# Patient Record
Sex: Female | Born: 1990 | Race: Black or African American | Hispanic: No | Marital: Single
Health system: Southern US, Community
[De-identification: ages and names within clinical notes are randomized; demographics above are authoritative.]

---

## 2017-09-01 ENCOUNTER — Emergency Department (HOSPITAL_COMMUNITY): Payer: Medicaid Other

## 2017-09-01 ENCOUNTER — Emergency Department (HOSPITAL_COMMUNITY)
Admission: EM | Admit: 2017-09-01 | Discharge: 2017-09-02 | Disposition: A | Discharge status: Home / Self Care | Payer: Medicaid Other | Attending: Emergency Medicine | Admitting: Emergency Medicine

## 2017-09-01 ENCOUNTER — Encounter (HOSPITAL_COMMUNITY): Payer: Self-pay | Admitting: Emergency Medicine

## 2017-09-01 DIAGNOSIS — E876 Hypokalemia: Secondary | ICD-10-CM | POA: Diagnosis not present

## 2017-09-01 DIAGNOSIS — R112 Nausea with vomiting, unspecified: Secondary | ICD-10-CM

## 2017-09-01 DIAGNOSIS — R51 Headache: Secondary | ICD-10-CM | POA: Insufficient documentation

## 2017-09-01 DIAGNOSIS — R05 Cough: Secondary | ICD-10-CM | POA: Insufficient documentation

## 2017-09-01 DIAGNOSIS — R1084 Generalized abdominal pain: Secondary | ICD-10-CM | POA: Diagnosis present

## 2017-09-01 DIAGNOSIS — R197 Diarrhea, unspecified: Secondary | ICD-10-CM | POA: Insufficient documentation

## 2017-09-01 LAB — COMPREHENSIVE METABOLIC PANEL
ALK PHOS: 59 U/L (ref 38–126)
ALK PHOS: 47 U/L (ref 38–126)
ALT: 25 U/L (ref 0–44)
ALT: 22 U/L (ref 0–44)
ANION GAP: 12 (ref 5–15)
ANION GAP: 7 (ref 5–15)
AST: 32 U/L (ref 15–41)
AST: 27 U/L (ref 15–41)
Albumin: 3.9 g/dL (ref 3.5–5.0)
Albumin: 3.2 g/dL — ABNORMAL LOW (ref 3.5–5.0)
BILIRUBIN TOTAL: 0.8 mg/dL (ref 0.3–1.2)
BUN: <5 mg/dL — ABNORMAL LOW (ref 6–20)
BUN: <5 mg/dL — ABNORMAL LOW (ref 6–20)
CALCIUM: 8.5 mg/dL — AB (ref 8.9–10.3)
CALCIUM: 7.3 mg/dL — AB (ref 8.9–10.3)
CHLORIDE: 101 mmol/L (ref 98–111)
CO2: 25 mmol/L (ref 22–32)
CO2: 23 mmol/L (ref 22–32)
Chloride: 107 mmol/L (ref 98–111)
Creatinine, Ser: 0.79 mg/dL (ref 0.44–1.00)
Creatinine, Ser: 0.66 mg/dL (ref 0.44–1.00)
GFR calc non Af Amer: >60 mL/min (ref >60)
GFR calc non Af Amer: >60 mL/min (ref >60)
Glucose, Bld: 96 mg/dL (ref 70–99)
Glucose, Bld: 91 mg/dL (ref 70–99)
POTASSIUM: 2.6 mmol/L — AB (ref 3.5–5.1)
Potassium: 2.9 mmol/L — ABNORMAL LOW (ref 3.5–5.1)
SODIUM: 138 mmol/L (ref 135–145)
Sodium: 137 mmol/L (ref 135–145)
TOTAL PROTEIN: 5.6 g/dL — AB (ref 6.5–8.1)
Total Bilirubin: 0.7 mg/dL (ref 0.3–1.2)
Total Protein: 7 g/dL (ref 6.5–8.1)

## 2017-09-01 LAB — URINALYSIS, ROUTINE W REFLEX MICROSCOPIC
Bilirubin Urine: NEGATIVE
GLUCOSE, UA: NEGATIVE mg/dL
KETONES UR: 5 mg/dL — AB
Leukocytes, UA: NEGATIVE
Nitrite: NEGATIVE
PROTEIN: NEGATIVE mg/dL
Specific Gravity, Urine: 1.03 (ref 1.005–1.030)
pH: 5 (ref 5.0–8.0)

## 2017-09-01 LAB — CBC
HEMATOCRIT: 35.9 % — AB (ref 36.0–46.0)
HEMOGLOBIN: 11.1 g/dL — AB (ref 12.0–15.0)
MCH: 25.2 pg — AB (ref 26.0–34.0)
MCHC: 30.9 g/dL (ref 30.0–36.0)
MCV: 81.6 fL (ref 78.0–100.0)
Platelets: 241 10*3/uL (ref 150–400)
RBC: 4.4 MIL/uL (ref 3.87–5.11)
RDW: 14.4 % (ref 11.5–15.5)
WBC: 5.7 10*3/uL (ref 4.0–10.5)

## 2017-09-01 LAB — LIPASE, BLOOD: LIPASE: 29 U/L (ref 11–51)

## 2017-09-01 LAB — I-STAT BETA HCG BLOOD, ED (MC, WL, AP ONLY): I-stat hCG, quantitative: <5 m[IU]/mL (ref <5)

## 2017-09-01 MED ORDER — MORPHINE SULFATE (PF) 4 MG/ML IV SOLN
4.0000 mg | Freq: Once | INTRAVENOUS | Status: AC
  Administered 2017-09-01: 4 mg via INTRAVENOUS
  Filled 2017-09-01: qty 1

## 2017-09-01 MED ORDER — POTASSIUM CHLORIDE CRYS ER 20 MEQ PO TBCR
40.0000 meq | EXTENDED_RELEASE_TABLET | Freq: Once | ORAL | Status: AC
  Administered 2017-09-01: 40 meq via ORAL
  Filled 2017-09-01: qty 2

## 2017-09-01 MED ORDER — SODIUM CHLORIDE 0.9 % IV BOLUS
1000.0000 mL | Freq: Once | INTRAVENOUS | Status: AC
  Administered 2017-09-01: 1000 mL via INTRAVENOUS

## 2017-09-01 MED ORDER — ONDANSETRON HCL 4 MG/2ML IJ SOLN
4.0000 mg | Freq: Once | INTRAMUSCULAR | Status: AC
  Administered 2017-09-01: 4 mg via INTRAVENOUS
  Filled 2017-09-01: qty 2

## 2017-09-01 MED ORDER — PROMETHAZINE HCL 25 MG/ML IJ SOLN
25.0000 mg | Freq: Once | INTRAMUSCULAR | Status: AC
  Administered 2017-09-01: 25 mg via INTRAVENOUS
  Filled 2017-09-01: qty 1

## 2017-09-01 MED ORDER — POTASSIUM CHLORIDE 10 MEQ/100ML IV SOLN
10.0000 meq | Freq: Once | INTRAVENOUS | Status: AC
  Administered 2017-09-01: 10 meq via INTRAVENOUS
  Filled 2017-09-01: qty 100

## 2017-09-01 NOTE — ED Provider Notes (Signed)
[facility] EMERGENCY DEPARTMENT Provider Note   CSN: [id number] Arrival date & time: 09/01/17  1753     History   Chief Complaint Chief Complaint  Patient presents with  . Abdominal Pain  . Emesis    HPI Alisha Burns is a 27 y.o. female.  The history is provided by the patient. No language interpreter was used.     27 year old female without any significant past medical history presenting for evaluation of abdominal pain.  Patient report for nearly a week she has had subjective fever, chills, headaches, body aches, persistent nonproductive cough, abdominal discomfort as well as nausea vomiting diarrhea.  States that she is unable to keep anything down as eating causes her to vomit nonbloody nonbilious contents as well as having loose stools without blood or mucus.  She feels dehydrated.  She tried to stay hydrated with fluid.  She denies earaches, runny nose, sneezing, sore throat or neck pain.  She recently finished her menstruation but denies any vaginal discharge or dysuria.  No recent travel.  History reviewed. No pertinent past medical history.  There are no active problems to display for this patient.   The histories are not reviewed yet. Please review them in the "History" navigator section and refresh this SmartLink.   OB History   None      Home Medications    Prior to Admission medications   Not on File    Family History No family history on file.  Social History Social History   Tobacco Use  . Smoking status: Not on file  Substance Use Topics  . Alcohol use: Not on file  . Drug use: Not on file     Allergies   Patient has no known allergies.   Review of Systems Review of Systems  All other systems reviewed and are negative.    Physical Exam Updated Vital Signs BP 127/82   Pulse 87   Temp 98.8 F (37.1 C)   Resp 17   LMP 08/25/2017 (LMP Unknown)   SpO2 98%   Physical Exam  Constitutional: She appears  well-developed and well-nourished. No distress.  HENT:  Head: Atraumatic.  Mouth/Throat: Oropharyngeal exudate present.  Eyes: Conjunctivae are normal.  Neck: Neck supple.  Cardiovascular: Normal rate and regular rhythm.  Pulmonary/Chest: Effort normal and breath sounds normal.  Abdominal: Soft. Normal appearance and bowel sounds are normal. There is generalized tenderness. There is no tenderness at McBurney's point and negative Murphy's sign.  Neurological: She is alert.  Skin: No rash noted.  Psychiatric: She has a normal mood and affect.  Nursing note and vitals reviewed.    ED Treatments / Results  Labs (all labs ordered are listed, but only abnormal results are displayed) Labs Reviewed  COMPREHENSIVE METABOLIC PANEL - Abnormal; Notable for the following components:      Result Value   Potassium 2.6 (*)    BUN <5 (*)    Calcium 8.5 (*)    All other components within normal limits  CBC - Abnormal; Notable for the following components:   Hemoglobin 11.1 (*)    HCT 35.9 (*)    MCH 25.2 (*)    All other components within normal limits  URINALYSIS, ROUTINE W REFLEX MICROSCOPIC - Abnormal; Notable for the following components:   APPearance HAZY (*)    Hgb urine dipstick LARGE (*)    Ketones, ur 5 (*)    Bacteria, UA RARE (*)    All other components within normal limits  COMPREHENSIVE METABOLIC PANEL - Abnormal; Notable for the following components:   Potassium 2.9 (*)    BUN <5 (*)    Calcium 7.3 (*)    Total Protein 5.6 (*)    Albumin 3.2 (*)    All other components within normal limits  LIPASE, BLOOD  I-STAT BETA HCG BLOOD, ED ([facility], [facility], [facility] ONLY)    EKG None   Date: 09/01/2017  Rate: 63  Rhythm: normal sinus rhythm with sinus arrhythmia  QRS Axis: normal  Intervals: normal  ST/T Wave abnormalities: normal  Conduction Disutrbances: none  Narrative Interpretation: no U-wave  Old EKG Reviewed: No significant changes noted     Radiology Dg Chest 2  View  Result Date: 09/01/2017 CLINICAL DATA:  Patient with productive cough. EXAM: CHEST - 2 VIEW COMPARISON:  None. FINDINGS: Normal cardiac and mediastinal contours. No consolidative pulmonary opacities. No pleural effusion or pneumothorax. Regional skeleton unremarkable. IMPRESSION: No acute cardiopulmonary process. Electronically Signed   By: Annia Belt M.D.   On: 09/01/2017 19:53    Procedures Procedures (including critical care time)  Medications Ordered in ED Medications  sodium chloride 0.9 % bolus 1,000 mL (0 mLs Intravenous Stopped 09/01/17 2116)  morphine 4 MG/ML injection 4 mg (4 mg Intravenous Given 09/01/17 1951)  ondansetron (ZOFRAN) injection 4 mg (4 mg Intravenous Given 09/01/17 1950)  potassium chloride SA (K-DUR,KLOR-CON) CR tablet 40 mEq (40 mEq Oral Given 09/01/17 1952)  potassium chloride 10 mEq in 100 mL IVPB (0 mEq Intravenous Stopped 09/01/17 2116)  sodium chloride 0.9 % bolus 1,000 mL (0 mLs Intravenous Stopped 09/01/17 2250)  ondansetron (ZOFRAN) injection 4 mg (4 mg Intravenous Given 09/01/17 2114)  morphine 4 MG/ML injection 4 mg (4 mg Intravenous Given 09/01/17 2303)  promethazine (PHENERGAN) injection 25 mg (25 mg Intravenous Given 09/01/17 2302)     Initial Impression / Assessment and Plan / ED Course  I have reviewed the triage vital signs and the nursing notes.  Pertinent labs & imaging results that were available during my care of the patient were reviewed by me and considered in my medical decision making (see chart for details).     BP 116/68   Pulse 65   Temp 98.8 F (37.1 C)   Resp 17   LMP 08/25/2017 (LMP Unknown)   SpO2 98%    Final Clinical Impressions(s) / ED Diagnoses   Final diagnoses:  Nausea vomiting and diarrhea  Hypokalemia    ED Discharge Orders         Ordered    potassium chloride SA (K-DUR,KLOR-CON) 20 MEQ tablet  Daily     09/02/17 0015    promethazine (PHENERGAN) 25 MG tablet  Every 6 hours PRN     09/02/17 0015           7:10 PM Patient complaining of abdominal pain with nausea vomiting diarrhea, suspect viral etiology.  She also endorsed persistent cough.  Her lungs exam is unremarkable however chest x-ray ordered to rule out pneumonia.  We will give IV fluid, antinausea medication and pain medication.  9:21 PM Evidence of hypokalemia with a potassium of 2.6.  No EKG changes.  Labs otherwise reassuring.  Suspect patient is having viral etiology causing her symptoms.  Urine with evidence of hemoglobin and urine dipsticks.  Patient recently finishing her menstruation which is likely the cause of blood in her urine.  Chest x-ray without evidence of pneumonia or concerning changes.  10:58 PM Patient still endorse nausea and headache but abdominal  pain is since improved.  On reexamination, no nuchal rigidity concerning for meningitis, abdomen is mildly tender but improved from prior.  We will continue with symptom medic treatment as well as potassium replenishment.  We will recheck CMP.  12:06 AM Potassium level is improving to 2.9 after treatment.  Patient also receive additional IV fluid and antinausea medication. Considered influenza, however due to prolonged course, doubt antiviral will be beneficial.  Minimal abd pain at this time, doubt biliary disease or acute abdominal pathology.    12:11 AM Patient able to ambulate, tolerates p.o., and felt better.  She feels comfortable going home.  She understands she can return promptly if her condition worsen.   Fayrene Helper, PA-C 09/02/17 Bernita Buffy    Shaune Pollack, MD 09/02/17 1254

## 2017-09-01 NOTE — ED Triage Notes (Signed)
Pt states 5 days of emesis and diarrhea with a generalized abdominal pain. Afebrile. LMP last week.

## 2017-09-01 NOTE — ED Notes (Signed)
CRITICAL VALUE STICKER  CRITICAL VALUE: K 2.6  DATE & TIME NOTIFIED: 09/01/17 1911  MD NOTIFIED: Fayrene Helper PA  TIME OF NOTIFICATION: 1911  RESPONSE: See new orders

## 2017-09-02 MED ORDER — PROMETHAZINE HCL 25 MG PO TABS: 25.0000 mg | ORAL_TABLET | Freq: Four times a day (QID) | ORAL | 0 refills | Status: AC | PRN

## 2017-09-02 MED ORDER — POTASSIUM CHLORIDE CRYS ER 20 MEQ PO TBCR: 20.0000 meq | EXTENDED_RELEASE_TABLET | Freq: Every day | ORAL | 0 refills | Status: AC

## 2017-09-02 NOTE — Discharge Instructions (Signed)
Your symptoms are likely due to a viral infection.  Take phenergan as needed for nausea.  Stay hydrated.  Your potassium level is low today, take supplementation as prescribed and have your blood work recheck next week.  Return if your condition worsen or if you have any concerns.

## 2018-01-02 ENCOUNTER — Emergency Department (HOSPITAL_COMMUNITY)
Admission: EM | Admit: 2018-01-02 | Discharge: 2018-01-02 | Discharge status: Against Medical Advice | Payer: Medicaid Other | Attending: Emergency Medicine | Admitting: Emergency Medicine

## 2018-01-02 ENCOUNTER — Encounter (HOSPITAL_COMMUNITY): Payer: Self-pay | Admitting: *Deleted

## 2018-01-02 DIAGNOSIS — Z5321 Procedure and treatment not carried out due to patient leaving prior to being seen by health care provider: Secondary | ICD-10-CM | POA: Diagnosis not present

## 2018-01-02 DIAGNOSIS — L02413 Cutaneous abscess of right upper limb: Secondary | ICD-10-CM | POA: Diagnosis present

## 2018-01-02 NOTE — ED Triage Notes (Signed)
Pt in c/o abscess to her right forearm, seen at urgent care for same and given antibiotic but area has not improved

## 2018-01-02 NOTE — ED Notes (Signed)
Unable to locate x 3

## 2018-01-02 NOTE — ED Notes (Signed)
Called pt twice with no response.

## 2019-07-20 IMAGING — DX DG CHEST 2V
2 series · 2 of 2 positions shown · non-contrast
Comparison: None.

CLINICAL DATA: Patient with productive cough.

EXAM:
CHEST - 2 VIEW

[chest pa]
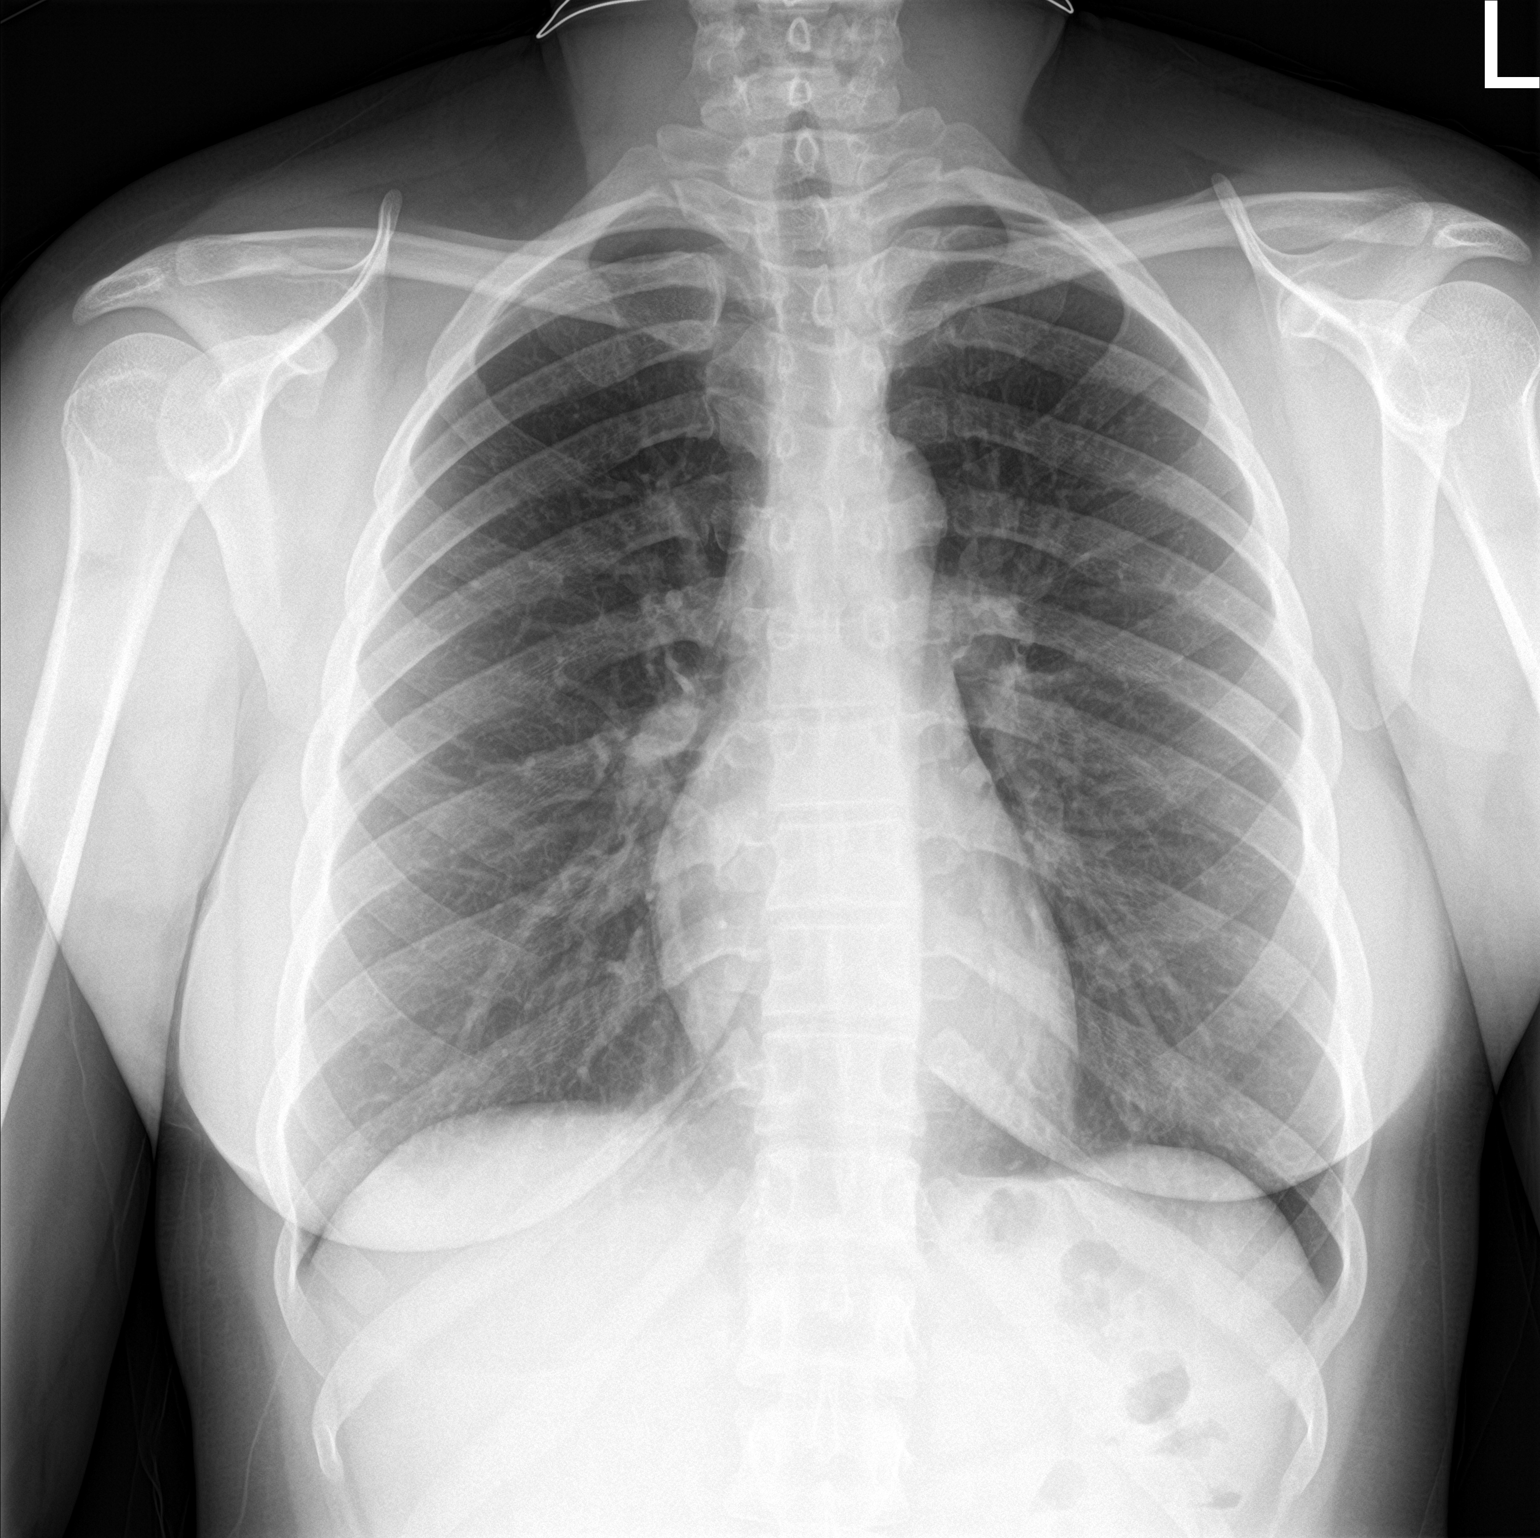

[chest lat]
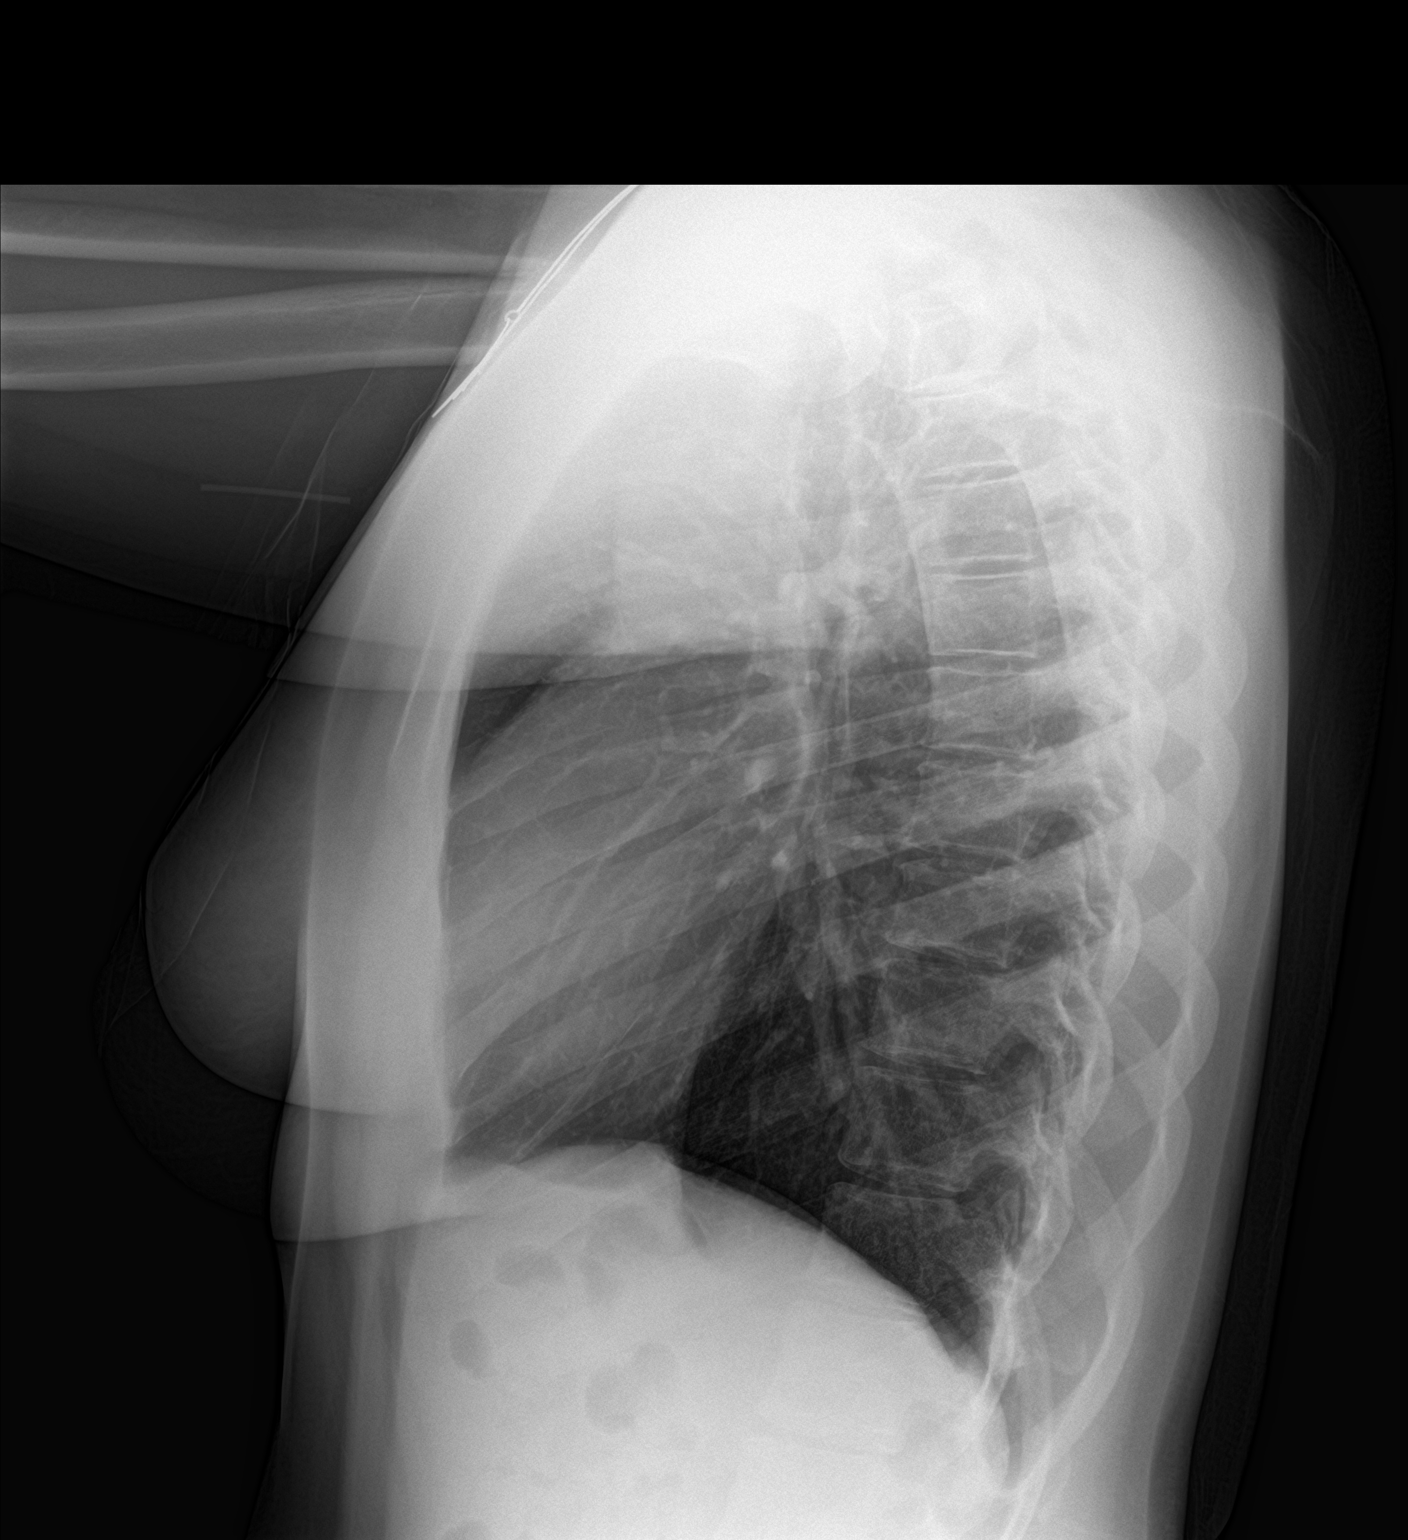

[2 of 2 positions shown; findings below may reference images not displayed]

FINDINGS: Normal cardiac and mediastinal contours. No consolidative pulmonary
opacities. No pleural effusion or pneumothorax. Regional skeleton
unremarkable.
IMPRESSION: No acute cardiopulmonary process.
